# Patient Record
Sex: Male | Born: 1965 | Race: Black or African American | Hispanic: No | Marital: Married | Smoking: Never smoker
Health system: Southern US, Community
[De-identification: ages and names within clinical notes are randomized; demographics above are authoritative.]

## PROBLEM LIST (undated history)

## (undated) DIAGNOSIS — M549 Dorsalgia, unspecified: Secondary | ICD-10-CM

## (undated) DIAGNOSIS — E119 Type 2 diabetes mellitus without complications: Secondary | ICD-10-CM

## (undated) DIAGNOSIS — I472 Ventricular tachycardia: Principal | ICD-10-CM

## (undated) DIAGNOSIS — I10 Essential (primary) hypertension: Secondary | ICD-10-CM

## (undated) DIAGNOSIS — I4891 Unspecified atrial fibrillation: Secondary | ICD-10-CM

## (undated) DIAGNOSIS — G8929 Other chronic pain: Secondary | ICD-10-CM

## (undated) HISTORY — PX: ELECTROPHYSIOLOGY STUDY: SHX5802

## (undated) HISTORY — DX: Dorsalgia, unspecified: M54.9

## (undated) HISTORY — PX: KNEE ARTHROSCOPY W/ MENISCAL REPAIR: SHX1877

## (undated) HISTORY — DX: Other chronic pain: G89.29

## (undated) HISTORY — DX: Ventricular tachycardia: I47.2

## (undated) HISTORY — DX: Unspecified atrial fibrillation: I48.91

## (undated) HISTORY — DX: Essential (primary) hypertension: I10

## (undated) HISTORY — DX: Type 2 diabetes mellitus without complications: E11.9

## (undated) HISTORY — PX: OTHER SURGICAL HISTORY: SHX169

---

## 2011-05-05 DIAGNOSIS — I4891 Unspecified atrial fibrillation: Secondary | ICD-10-CM

## 2011-05-05 HISTORY — DX: Unspecified atrial fibrillation: I48.91

## 2015-05-05 DIAGNOSIS — I472 Ventricular tachycardia, unspecified: Secondary | ICD-10-CM

## 2015-05-05 HISTORY — DX: Ventricular tachycardia: I47.2

## 2015-05-05 HISTORY — DX: Ventricular tachycardia, unspecified: I47.20

## 2016-01-15 DIAGNOSIS — I509 Heart failure, unspecified: Secondary | ICD-10-CM | POA: Insufficient documentation

## 2016-01-15 DIAGNOSIS — I10 Essential (primary) hypertension: Secondary | ICD-10-CM | POA: Insufficient documentation

## 2016-01-15 DIAGNOSIS — E119 Type 2 diabetes mellitus without complications: Secondary | ICD-10-CM | POA: Insufficient documentation

## 2016-01-15 DIAGNOSIS — I4891 Unspecified atrial fibrillation: Secondary | ICD-10-CM | POA: Insufficient documentation

## 2016-01-19 DIAGNOSIS — I472 Ventricular tachycardia: Secondary | ICD-10-CM | POA: Insufficient documentation

## 2016-01-19 DIAGNOSIS — R000 Tachycardia, unspecified: Secondary | ICD-10-CM | POA: Insufficient documentation

## 2016-01-20 DIAGNOSIS — I471 Supraventricular tachycardia, unspecified: Secondary | ICD-10-CM | POA: Insufficient documentation

## 2016-01-21 DIAGNOSIS — I428 Other cardiomyopathies: Secondary | ICD-10-CM | POA: Insufficient documentation

## 2016-01-21 DIAGNOSIS — I429 Cardiomyopathy, unspecified: Secondary | ICD-10-CM | POA: Insufficient documentation

## 2016-01-23 HISTORY — PX: IMPLANTABLE CARDIOVERTER DEFIBRILLATOR IMPLANT: SHX5860

## 2016-02-04 ENCOUNTER — Telehealth: Payer: Self-pay | Admitting: Physician Assistant

## 2016-02-04 ENCOUNTER — Encounter: Payer: Self-pay | Admitting: Physician Assistant

## 2016-02-04 NOTE — Progress Notes (Deleted)
   Cardiology Office Note Date:  02/04/2016  Patient ID:  Rodney Beard, DOB 1965-05-11, MRN [id number] PCP:  No primary care provider on file.  Cardiologist:  ***  ***refresh   Chief Complaint: ***  History of Present Illness: Rodney Beard is a 50 y.o. male with history of ***   *** Family hx *** new patient *** new device   Past Medical History:  Diagnosis Date  . SVT (supraventricular tachycardia) (HCC)     No past surgical history on file.  Current Outpatient Prescriptions  Medication Sig Dispense Refill  . apixaban (ELIQUIS) 5 MG TABS tablet Take 5 mg by mouth 2 (two) times daily.    . carvedilol (COREG) 12.5 MG tablet Take 6.25 mg by mouth 2 (two) times daily with a meal.    . furosemide (LASIX) 40 MG tablet Take 40 mg by mouth 2 (two) times daily.    Marland Kitchen losartan (COZAAR) 25 MG tablet Take 25 mg by mouth daily.    . pantoprazole (PROTONIX) 40 MG tablet Take 40 mg by mouth daily.    . potassium chloride (MICRO-K) 10 MEQ CR capsule Take 10 mEq by mouth 2 (two) times daily.    . sotalol (BETAPACE) 120 MG tablet Take 120 mg by mouth 2 (two) times daily.    . traMADol (ULTRAM) 50 MG tablet Take by mouth daily as needed for moderate pain.     No current facility-administered medications for this visit.     Allergies:   Review of patient's allergies indicates no known allergies.   Social History:  The patient     Family History:  The patient's family history is not on file.***  ROS:  Please see the history of present illness. Otherwise, review of systems is positive for    All other systems are reviewed and otherwise negative.   PHYSICAL EXAM: *** VS:  There were no vitals taken for this visit. BMI: There is no height or weight on file to calculate BMI. Well nourished, well developed, in no acute distress HEENT: normocephalic, atraumatic Neck: no JVD, carotid bruits or masses Cardiac:  normal S1, S2; RRR; no significant murmurs, no rubs, or gallops Lungs:   clear to auscultation bilaterally, no wheezing, rhonchi or rales Abd: soft, nontender MS: no deformity or atrophy Ext: no edema Skin: warm and dry, no rash Neuro:  No gross deficits appreciated Psych: euthymic mood, full affect  *** ICD site is stable, no tethering or discomfort   EKG:  Done today shows ***  Recent Labs: No results found for requested labs within last 8760 hours.  No results found for requested labs within last 8760 hours.   CrCl cannot be calculated (Unknown ideal weight.).   Wt Readings from Last 3 Encounters:  No data found for Wt     Other studies reviewed: Additional studies/records reviewed today include: summarized above***  ASSESSMENT AND PLAN:   1. ***  Disposition: F/u with ***  Current medicines are reviewed at length with the patient today.  The patient did not have any concerns regarding medicines.***  Signed, Sherrilee Gilles, PA-C 02/04/2016 2:34 PM     [facility] [address] [place] [address] [phone] (office)  [phone] (fax)

## 2016-02-04 NOTE — Telephone Encounter (Signed)
Called pt and left message asking pt to call back to update his Fm and medical Hx. °

## 2016-02-05 ENCOUNTER — Encounter: Payer: Self-pay | Admitting: Physician Assistant

## 2016-02-17 ENCOUNTER — Encounter: Payer: Self-pay | Admitting: Physician Assistant

## 2016-02-17 NOTE — Progress Notes (Deleted)
   Cardiology Office Note Date:  02/04/2016  Patient ID:  Rodney Beard, DOB 08/28/1965, MRN [id number] PCP:  No primary care provider on file.  Cardiologist:  ***  ***refresh   Chief Complaint: ***  History of Present Illness: Rodney Beard is a 50 y.o. male with history of ***   *** Family hx *** new patient *** new device   Past Medical History:  Diagnosis Date  . SVT (supraventricular tachycardia) (HCC)     No past surgical history on file.  Current Outpatient Prescriptions  Medication Sig Dispense Refill  . apixaban (ELIQUIS) 5 MG TABS tablet Take 5 mg by mouth 2 (two) times daily.    . carvedilol (COREG) 12.5 MG tablet Take 6.25 mg by mouth 2 (two) times daily with a meal.    . furosemide (LASIX) 40 MG tablet Take 40 mg by mouth 2 (two) times daily.    . losartan (COZAAR) 25 MG tablet Take 25 mg by mouth daily.    . pantoprazole (PROTONIX) 40 MG tablet Take 40 mg by mouth daily.    . potassium chloride (MICRO-K) 10 MEQ CR capsule Take 10 mEq by mouth 2 (two) times daily.    . sotalol (BETAPACE) 120 MG tablet Take 120 mg by mouth 2 (two) times daily.    . traMADol (ULTRAM) 50 MG tablet Take by mouth daily as needed for moderate pain.     No current facility-administered medications for this visit.     Allergies:   Review of patient's allergies indicates no known allergies.   Social History:  The patient     Family History:  The patient's family history is not on file.***  ROS:  Please see the history of present illness. Otherwise, review of systems is positive for    All other systems are reviewed and otherwise negative.   PHYSICAL EXAM: *** VS:  There were no vitals taken for this visit. BMI: There is no height or weight on file to calculate BMI. Well nourished, well developed, in no acute distress HEENT: normocephalic, atraumatic Neck: no JVD, carotid bruits or masses Cardiac:  normal S1, S2; RRR; no significant murmurs, no rubs, or gallops Lungs:   clear to auscultation bilaterally, no wheezing, rhonchi or rales Abd: soft, nontender MS: no deformity or atrophy Ext: no edema Skin: warm and dry, no rash Neuro:  No gross deficits appreciated Psych: euthymic mood, full affect  *** ICD site is stable, no tethering or discomfort   EKG:  Done today shows ***  Recent Labs: No results found for requested labs within last 8760 hours.  No results found for requested labs within last 8760 hours.   CrCl cannot be calculated (Unknown ideal weight.).   Wt Readings from Last 3 Encounters:  No data found for Wt     Other studies reviewed: Additional studies/records reviewed today include: summarized above***  ASSESSMENT AND PLAN:   1. ***  Disposition: F/u with ***  Current medicines are reviewed at length with the patient today.  The patient did not have any concerns regarding medicines.***  Signed, Renee Ursy, PA-C 02/04/2016 2:34 PM     [facility] [address] [place] [address] [phone] (office)  [phone] (fax)   

## 2016-02-18 ENCOUNTER — Encounter: Payer: Self-pay | Admitting: Physician Assistant

## 2016-02-18 NOTE — Progress Notes (Signed)
Cardiology Office Note Date: 02/04/2016 Patient ID: Rodney Beard, DOB 04/12/1966, MRN [id number] PCP:No primary care provider on file. [facility] Dr. Elberta Fortisamnitz   Chief Complaint:new ICD [place] in [place]  History of Present Illness: Rodney Pattersonis a 50 y.o.malewith history of HTN, DM diet controlled, and PAFib, most recently vound with sustained VT and NICM s/p ICD implant last month in [place].  He reports that he was in his [place] when he developed an unusual sensation in his chest, breathless, and weak, got home, checked his BP found it low and went [facility] [place] he states they told him he has a dangerous fast rhythm from the bottom of his heart and transferred hom to a larger hospital in [place] where he reprots having a cardiac cath without CAD (this is his second, the first was in 2013 when found with AF), and what sounds like an EPS and states theytold him they were unable to fix his arrhythmia and ultimately had an ICD implant.  He was started on his current regime (his Eliquis and Tramadol was on previously) and has felt well since.  He will occasionally be aware of his heart beat, no other symptoms.  No CP or SOB, but has not been doin much in the way of physical activity since.  No dizziness, near syncope or syncope, no shocks.   Device information: BSci dual chamber ICD, implanted 01/23/16, Dr. Glory RosebushJohn Russell Balley, NICM, sustained VT/secondary prevention       Past Medical History:  Diagnosis Date  . SVT (supraventricular tachycardia) (HCC)     No past surgical history on file.        Current Outpatient Prescriptions  Medication Sig Dispense Refill  . apixaban (ELIQUIS) 5 MG TABS tablet Take 5 mg by mouth 2 (two) times daily.    . carvedilol (COREG) 12.5 MG tablet Take 6.25 mg by mouth 2 (two) times daily with a meal.    . furosemide (LASIX) 40 MG tablet Take 40 mg by mouth 2 (two) times  daily.    Marland Kitchen. losartan (COZAAR) 25 MG tablet Take 25 mg by mouth daily.    . pantoprazole (PROTONIX) 40 MG tablet Take 40 mg by mouth daily.    . potassium chloride (MICRO-K) 10 MEQ CR capsule Take 10 mEq by mouth 2 (two) times daily.    . sotalol (BETAPACE) 120 MG tablet Take 120 mg by mouth 2 (two) times daily.    . traMADol (ULTRAM) 50 MG tablet Take by mouth daily as needed for moderate pain.     No current facility-administered medications for this visit.     Allergies: Iodine, shellfish with SOB and facial edema  Social History: never smoked, no ETOH, drugs   Family History:The patient's family history is noted in his chart, + heart disease bu his MOM, HTN, CVA by his father, a brother recently died of MI  [id number]:Please see the history of present illness. Otherwise, review of systems is positive for  All other systems are reviewed and otherwise negative.   PHYSICAL EXAM: VS:118/84,  76bpm,  283lbs Well nourished, well developed, in no acute distress  HEENT: normocephalic, atraumatic  Neck: no JVD, carotid bruits or masses Cardiac:  RRR; no significant murmurs, no rubs, or gallops Lungs: clear to auscultation bilaterally, no wheezing, rhonchi or rales  Abd: soft, nontender MS: no deformity or atrophy Ext: no edema  Skin: warm and dry, no rash Neuro: No gross deficits appreciated Psych:euthymic mood, full affect  ICD  site is stable, no tethering or discomfort, steri strips removed   TXH:FSFS today reviewed by myself/Dr. Elberta Fortis shows SR, inf/lat T changes, QTc , PVC's, couplet ICD interrogation today reviewed by myself/Dr. Elberta Fortis notes: normal device function, 2 very brief NSVT  Recent Labs: No results found for requested labs within last 8760 hours. No results found for requested labs within last 8760 hours.  CrCl cannot be calculated (Unknown ideal weight.).  Wt Readings from Last 3 Encounters:  No data found for Wt      Other studies reviewed: Additional studies/records reviewed today include: summarized above  ASSESSMENT AND PLAN:  1. NICM     Exam appears euvolemic  2. VT w/ICD     Normal device function, will plan to change out puts at his 3 mo f/u visit to chronic settings     Site is stable     Patient is reminded of his L arm activity restrictions  3. HTN     Stable  4. PAFib     CHA2DS2Vasc is at least 2, on Eliquis    Will get BMET, CBC, Mag today given PVCs, new meds and his a/c He has signed to get his records from [facility] in [place]   Disposition: F/u with Dr. Elberta Fortis in 85mo, sooner if needed.  Current medicines are reviewed at length with the patient today. The patient did not have any concerns regarding medicines.  Signed, Sherrilee Gilles, PA-C 10/3/20172:34 PM  [facility] [address] [place] [address] [phone] (office)  [phone] (fax)

## 2016-02-19 ENCOUNTER — Encounter (INDEPENDENT_AMBULATORY_CARE_PROVIDER_SITE_OTHER): Payer: Self-pay

## 2016-02-19 ENCOUNTER — Encounter: Payer: Self-pay | Admitting: Physician Assistant

## 2016-02-19 ENCOUNTER — Ambulatory Visit (INDEPENDENT_AMBULATORY_CARE_PROVIDER_SITE_OTHER): Admitting: Physician Assistant

## 2016-02-19 VITALS — BP 118/84 | HR 76 | Ht 72.0 in | Wt 283.0 lb

## 2016-02-19 DIAGNOSIS — I480 Paroxysmal atrial fibrillation: Secondary | ICD-10-CM | POA: Diagnosis not present

## 2016-02-19 DIAGNOSIS — I472 Ventricular tachycardia, unspecified: Secondary | ICD-10-CM

## 2016-02-19 DIAGNOSIS — I420 Dilated cardiomyopathy: Secondary | ICD-10-CM

## 2016-02-19 DIAGNOSIS — I10 Essential (primary) hypertension: Secondary | ICD-10-CM

## 2016-02-19 DIAGNOSIS — I471 Supraventricular tachycardia: Secondary | ICD-10-CM | POA: Diagnosis not present

## 2016-02-19 DIAGNOSIS — Z5181 Encounter for therapeutic drug level monitoring: Secondary | ICD-10-CM

## 2016-02-19 LAB — BASIC METABOLIC PANEL
BUN: 15 mg/dL (ref 7–25)
CHLORIDE: 104 mmol/L (ref 98–110)
CO2: 26 mmol/L (ref 20–31)
CREATININE: 1.12 mg/dL (ref 0.70–1.33)
Calcium: 9.1 mg/dL (ref 8.6–10.3)
Glucose, Bld: 160 mg/dL — ABNORMAL HIGH (ref 65–99)
POTASSIUM: 4.1 mmol/L (ref 3.5–5.3)
Sodium: 140 mmol/L (ref 135–146)

## 2016-02-19 LAB — CBC
HEMATOCRIT: 42.1 % (ref 38.5–50.0)
HEMOGLOBIN: 14.3 g/dL (ref 13.2–17.1)
MCH: 32.9 pg (ref 27.0–33.0)
MCHC: 34 g/dL (ref 32.0–36.0)
MCV: 97 fL (ref 80.0–100.0)
MPV: 10.3 fL (ref 7.5–12.5)
PLATELETS: 248 10*3/uL (ref 140–400)
RBC: 4.34 MIL/uL (ref 4.20–5.80)
RDW: 12.4 % (ref 11.0–15.0)
WBC: 8.2 10*3/uL (ref 3.8–10.8)

## 2016-02-19 LAB — MAGNESIUM: MAGNESIUM: 2.2 mg/dL (ref 1.5–2.5)

## 2016-02-19 NOTE — Patient Instructions (Addendum)
Medication Instructions:   Your physician recommends that you continue on your current medications as directed. Please refer to the Current Medication list given to you today.   If you need a refill on your cardiac medications before your next appointment, please call your pharmacy.  Labwork: BMET  MAG AND  CBC TODAY    Testing/Procedures: NONE ORDER TODAY     Follow-Up:  In 3 MONTHS WITH DR CAMNITZ  Any Other Special Instructions Will Be Listed Below (If Applicable).

## 2016-02-25 ENCOUNTER — Telehealth: Payer: Self-pay | Admitting: *Deleted

## 2016-02-25 NOTE — Telephone Encounter (Signed)
LMOVM TO CALL BACK  

## 2016-02-25 NOTE — Telephone Encounter (Signed)
-----   Message from [facility] sent at 02/25/2016  4:54 AM EDT ----- Please let the patient know his labs look OK, electrolytes, kidney function and blood counts.  Thanks [employer]

## 2016-02-26 ENCOUNTER — Telehealth: Payer: Self-pay | Admitting: Physician Assistant

## 2016-02-26 ENCOUNTER — Telehealth: Payer: Self-pay | Admitting: *Deleted

## 2016-02-26 NOTE — Telephone Encounter (Signed)
SPOKE TO PT ABOUT RESULTS AND VERBALIZED UNDERSTANDING  

## 2016-02-26 NOTE — Telephone Encounter (Signed)
New Message  Pt voiced he is returning call about results.  Please f/u with pt

## 2016-02-26 NOTE — Telephone Encounter (Signed)
-----   Message from Renee Lynn Ursuy, PA-C sent at 02/25/2016  4:54 AM EDT ----- Please let the patient know his labs look OK, electrolytes, kidney function and blood counts.  Thanks Renee 

## 2016-04-29 ENCOUNTER — Encounter: Payer: Self-pay | Admitting: *Deleted

## 2016-05-18 ENCOUNTER — Encounter: Admitting: Cardiology

## 2016-05-24 ENCOUNTER — Emergency Department (HOSPITAL_COMMUNITY)
Admission: EM | Admit: 2016-05-24 | Discharge: 2016-05-24 | Disposition: A | Discharge status: Home / Self Care | Attending: Emergency Medicine | Admitting: Emergency Medicine

## 2016-05-24 ENCOUNTER — Emergency Department (HOSPITAL_COMMUNITY)

## 2016-05-24 ENCOUNTER — Encounter (HOSPITAL_COMMUNITY): Payer: Self-pay

## 2016-05-24 DIAGNOSIS — D68318 Other hemorrhagic disorder due to intrinsic circulating anticoagulants, antibodies, or inhibitors: Secondary | ICD-10-CM | POA: Insufficient documentation

## 2016-05-24 DIAGNOSIS — Y92481 Parking lot as the place of occurrence of the external cause: Secondary | ICD-10-CM | POA: Insufficient documentation

## 2016-05-24 DIAGNOSIS — S0990XA Unspecified injury of head, initial encounter: Secondary | ICD-10-CM | POA: Insufficient documentation

## 2016-05-24 DIAGNOSIS — I10 Essential (primary) hypertension: Secondary | ICD-10-CM | POA: Insufficient documentation

## 2016-05-24 DIAGNOSIS — W01119A Fall on same level from slipping, tripping and stumbling with subsequent striking against unspecified sharp object, initial encounter: Secondary | ICD-10-CM | POA: Diagnosis not present

## 2016-05-24 DIAGNOSIS — Z79899 Other long term (current) drug therapy: Secondary | ICD-10-CM | POA: Diagnosis not present

## 2016-05-24 DIAGNOSIS — Y9301 Activity, walking, marching and hiking: Secondary | ICD-10-CM | POA: Insufficient documentation

## 2016-05-24 DIAGNOSIS — E119 Type 2 diabetes mellitus without complications: Secondary | ICD-10-CM | POA: Diagnosis not present

## 2016-05-24 DIAGNOSIS — Z7901 Long term (current) use of anticoagulants: Secondary | ICD-10-CM | POA: Insufficient documentation

## 2016-05-24 DIAGNOSIS — Y999 Unspecified external cause status: Secondary | ICD-10-CM | POA: Insufficient documentation

## 2016-05-24 LAB — CBC WITH DIFFERENTIAL/PLATELET
BASOS ABS: 0 10*3/uL (ref 0.0–0.1)
BASOS PCT: 0 %
EOS PCT: 2 %
Eosinophils Absolute: 0.1 10*3/uL (ref 0.0–0.7)
HCT: 42 % (ref 39.0–52.0)
HEMOGLOBIN: 13.9 g/dL (ref 13.0–17.0)
LYMPHS ABS: 1.8 10*3/uL (ref 0.7–4.0)
Lymphocytes Relative: 28 %
MCH: 32.1 pg (ref 26.0–34.0)
MCHC: 33.1 g/dL (ref 30.0–36.0)
MCV: 97 fL (ref 78.0–100.0)
Monocytes Absolute: 0.4 10*3/uL (ref 0.1–1.0)
Monocytes Relative: 6 %
NEUTROS PCT: 64 %
Neutro Abs: 4.2 10*3/uL (ref 1.7–7.7)
PLATELETS: 254 10*3/uL (ref 150–400)
RBC: 4.33 MIL/uL (ref 4.22–5.81)
RDW: 12.3 % (ref 11.5–15.5)
WBC: 6.5 10*3/uL (ref 4.0–10.5)

## 2016-05-24 LAB — BASIC METABOLIC PANEL
Anion gap: 7 (ref 5–15)
BUN: 12 mg/dL (ref 6–20)
CHLORIDE: 102 mmol/L (ref 101–111)
CO2: 27 mmol/L (ref 22–32)
CREATININE: 1.22 mg/dL (ref 0.61–1.24)
Calcium: 9.2 mg/dL (ref 8.9–10.3)
GFR calc non Af Amer: >60 mL/min (ref >60)
GLUCOSE: 173 mg/dL — AB (ref 65–99)
Potassium: 4.5 mmol/L (ref 3.5–5.1)
Sodium: 136 mmol/L (ref 135–145)

## 2016-05-24 NOTE — ED Notes (Signed)
Patient transported to CT 

## 2016-05-24 NOTE — Discharge Instructions (Signed)
Tylenol 1000 mg every 6 hours as needed for headache.  Return to the emergency department if you develop worsening headache, confusion, visual disturbances, vomiting, balance or gait disturbances, or other new and concerning symptoms.

## 2016-05-24 NOTE — ED Triage Notes (Signed)
Pt. Was going to church and slipped on ice.  Pt. Fell straight back, hematoma to the back of his head.    Pt. Is alert and oriented X4.   He denies any pain.  Pt. Has a hx of A-fib and HR 127-130. He denies chest pain or sob.  Skin is warm and dry.

## 2016-05-24 NOTE — ED Provider Notes (Signed)
[facility]-EMERGENCY DEPT Provider Note   CSN: [id number] Arrival date & time: 05/24/16  4098     History   Chief Complaint Chief Complaint  Patient presents with  . Fall  . Head Injury    HPI Rodney Beard is a 51 y.o. male.  Patient is a 51 year old male with history of A. fib, V. tach, hypertension, diabetes. He presents for evaluation of a fall. He was walking through the parking lot on his way to church when he slipped on ice, fell backward, and hit the back of his head on the concrete. He denies any loss of consciousness, headache, or neck pain. He does take blood thinners.   The history is provided by the patient.  Fall  This is a new problem. The current episode started less than 1 hour ago. The problem occurs constantly. The problem has not changed since onset.Pertinent negatives include no chest pain, no abdominal pain, no headaches and no shortness of breath. Nothing aggravates the symptoms. Nothing relieves the symptoms. He has tried nothing for the symptoms.  Head Injury      Past Medical History:  Diagnosis Date  . Atrial fibrillation (HCC) 2013  . Chronic back pain   . Diabetes (HCC)    diet controlled  . Hypertension   . Ventricular tachycardia (HCC) 2017    There are no active problems to display for this patient.   Past Surgical History:  Procedure Laterality Date  . ELECTROPHYSIOLOGY STUDY     per the patient, Sept 2017, "unable to fix his arrhythmia"  . IMPLANTABLE CARDIOVERTER DEFIBRILLATOR IMPLANT  01/23/2016  . KNEE ARTHROSCOPY W/ MENISCAL REPAIR Left   . left heart cath     per the patient , negetive for CAD, Sept 2017       Home Medications    Prior to Admission medications   Medication Sig Start Date End Date Taking? Authorizing Provider  apixaban (ELIQUIS) 5 MG TABS tablet Take 5 mg by mouth 2 (two) times daily.    Historical Provider, MD  carvedilol (COREG) 12.5 MG tablet Take 6.25 mg by mouth 2 (two) times daily with a meal.     Historical Provider, MD  furosemide (LASIX) 40 MG tablet Take 40 mg by mouth 2 (two) times daily.    Historical Provider, MD  losartan (COZAAR) 25 MG tablet Take 25 mg by mouth daily.    Historical Provider, MD  pantoprazole (PROTONIX) 40 MG tablet Take 40 mg by mouth daily.    Historical Provider, MD  potassium chloride (MICRO-K) 10 MEQ CR capsule Take 10 mEq by mouth 2 (two) times daily.    Historical Provider, MD  sotalol (BETAPACE) 120 MG tablet Take 120 mg by mouth 2 (two) times daily.    Historical Provider, MD  traMADol (ULTRAM) 50 MG tablet Take by mouth daily as needed for moderate pain.    Historical Provider, MD    Family History Family History  Problem Relation Age of Onset  . Heart disease Mother   . Stroke Father   . Hypertension Father   . Heart attack Brother     Social History Social History  Substance Use Topics  . Smoking status: Never Smoker  . Smokeless tobacco: Never Used  . Alcohol use No     Allergies   Iodine and Shellfish allergy   Review of Systems Review of Systems  Respiratory: Negative for shortness of breath.   Cardiovascular: Negative for chest pain.  Gastrointestinal: Negative for abdominal pain.  Neurological:  Negative for headaches.  All other systems reviewed and are negative.    Physical Exam Updated Vital Signs BP 127/80 (BP Location: Left Arm)   Pulse (!) 128   Temp 97.6 F (36.4 C) (Oral)   Resp 20   Ht 6' (1.829 m)   Wt 279 lb 1 oz (126.6 kg)   SpO2 98%   BMI 37.85 kg/m   Physical Exam  Constitutional: He is oriented to person, place, and time. He appears well-developed and well-nourished. No distress.  HENT:  Head: Normocephalic.  Mouth/Throat: Oropharynx is clear and moist.  There is mild swelling and and abrasion to the occiput  Eyes: EOM are normal. Pupils are equal, round, and reactive to light.  Neck: Normal range of motion. Neck supple.  There is no cervical spine tenderness. Patient has painless range of  motion in all directions.  Cardiovascular: Normal rate and regular rhythm.  Exam reveals no friction rub.   No murmur heard. Pulmonary/Chest: Effort normal and breath sounds normal. No respiratory distress. He has no wheezes. He has no rales.  Abdominal: Soft. Bowel sounds are normal. He exhibits no distension. There is no tenderness.  Musculoskeletal: Normal range of motion. He exhibits no edema.  Neurological: He is alert and oriented to person, place, and time. No cranial nerve deficit. He exhibits normal muscle tone. Coordination normal.  Skin: Skin is warm and dry. He is not diaphoretic.  Nursing note and vitals reviewed.    ED Treatments / Results  Labs (all labs ordered are listed, but only abnormal results are displayed) Labs Reviewed - No data to display  EKG  EKG Interpretation  Date/Time:  Sunday May 24 2016 08:43:02 EST Ventricular Rate:  74 PR Interval:  188 QRS Duration: 74 QT Interval:  414 QTC Calculation: 459 R Axis:   74 Text Interpretation:  Atrial-paced rhythm with frequent Premature ventricular complexes Anterior infarct , age undetermined T wave abnormality, consider inferolateral ischemia Abnormal ECG Confirmed by Innocence Schlotzhauer  MD, Jacquelin Krajewski ([id number]) on 05/24/2016 8:56:53 AM       Radiology No results found.  Procedures Procedures (including critical care time)  Medications Ordered in ED Medications - No data to display   Initial Impression / Assessment and Plan / ED Course  I have reviewed the triage vital signs and the nursing notes.  Pertinent labs & imaging results that were available during my care of the patient were reviewed by me and considered in my medical decision making (see chart for details).     Patient taking eliquis, fell this morning walking to church and hit his head. He is neurologically intact and CT is negative. He will be discharged, to return as needed for any problems.  Final Clinical Impressions(s) / ED Diagnoses   Final  diagnoses:  None    New Prescriptions New Prescriptions   No medications on file     Geoffery Lyons, MD 05/24/16 1130

## 2016-11-23 ENCOUNTER — Encounter: Payer: Self-pay | Admitting: Cardiology

## 2017-07-18 IMAGING — CT CT HEAD W/O CM
4 series · 17 of 47 positions shown, 19 images · non-contrast
Comparison: None.

CLINICAL DATA: Pain after fall.

EXAM:
CT HEAD WITHOUT CONTRAST
TECHNIQUE: Contiguous axial images were obtained from the base of the skull
through the vertex without intravenous contrast.

[Series 2: head without · axial · non-contrast · 0.48mm/px · z∈[-130,-5]mm · 7 of 35 slices shown, 9 images]
[im 5/35  brain]
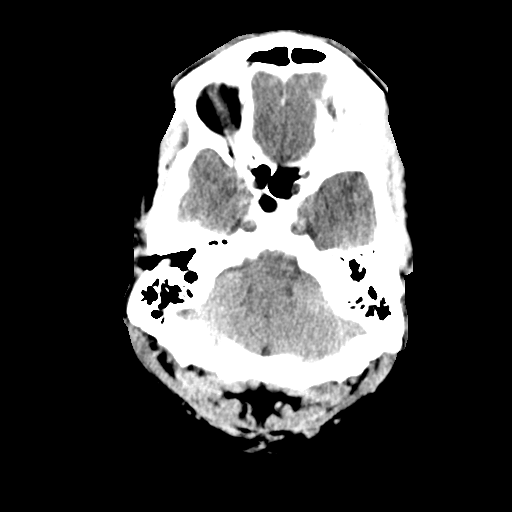
[im 5/35  bone]
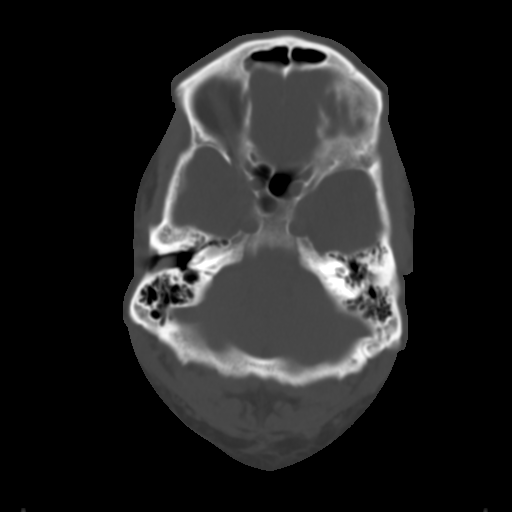
[im 9/35  brain]
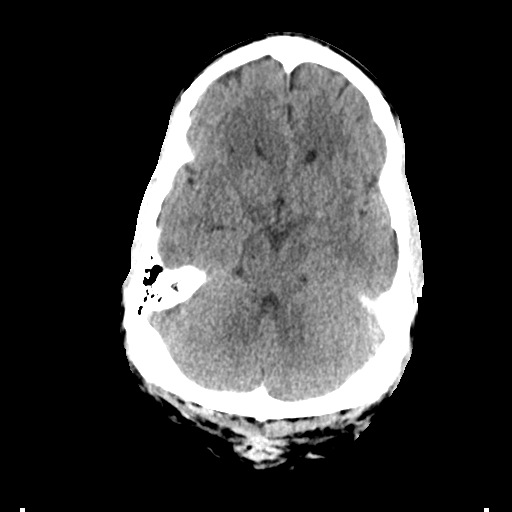
[im 13/35  brain]
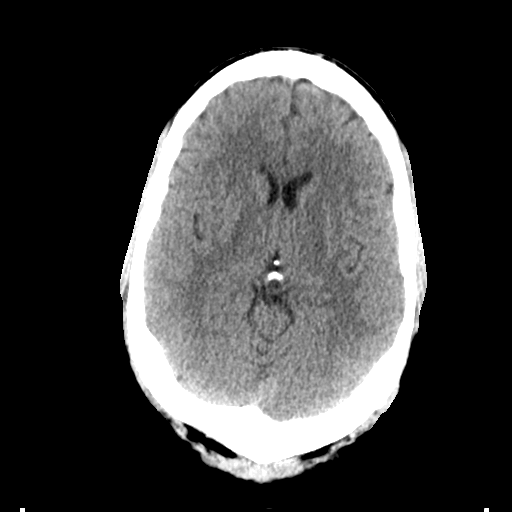
[im 18/35  brain]
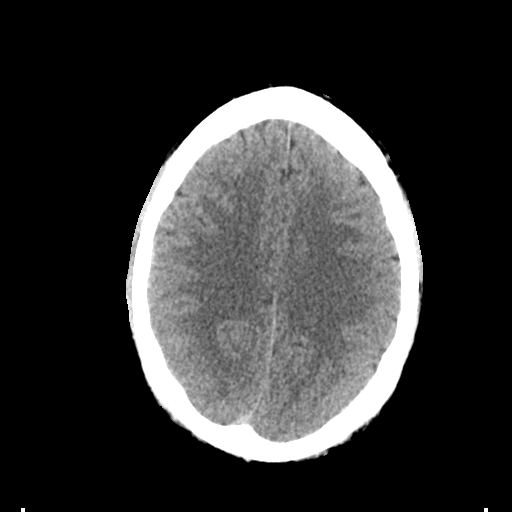
[im 22/35  brain]
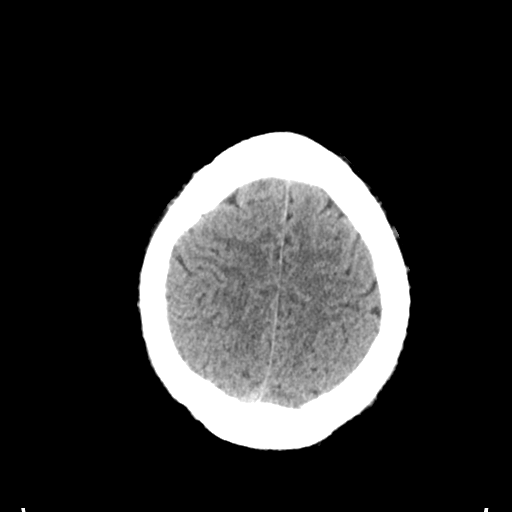
[im 22/35  bone]
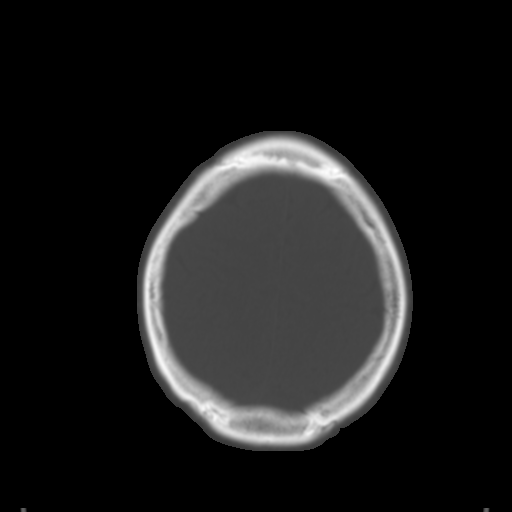
[im 26/35  brain]
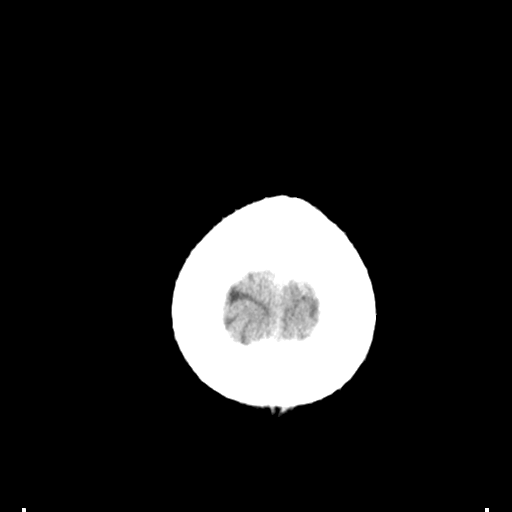
[im 30/35  brain]
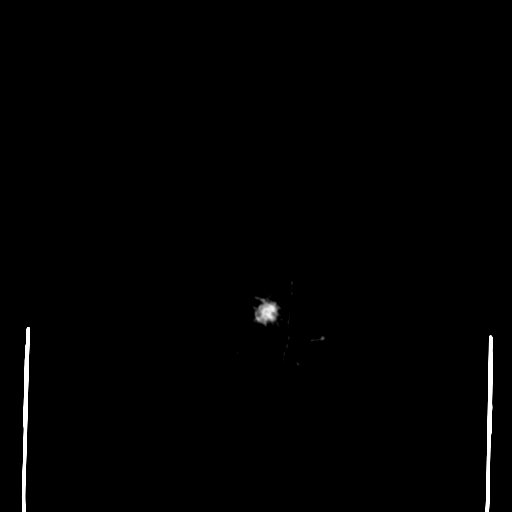

[Series 3: head bone · axial · 0.48mm/px · z∈[-134,-74]mm · 4 of 86 slices shown]
[im 9/86  bone]
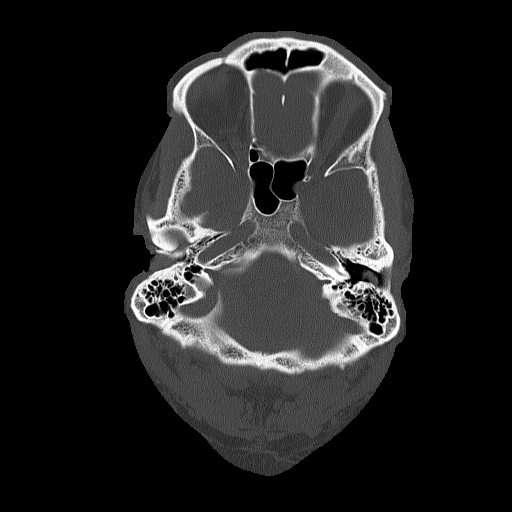
[im 18/86  bone]
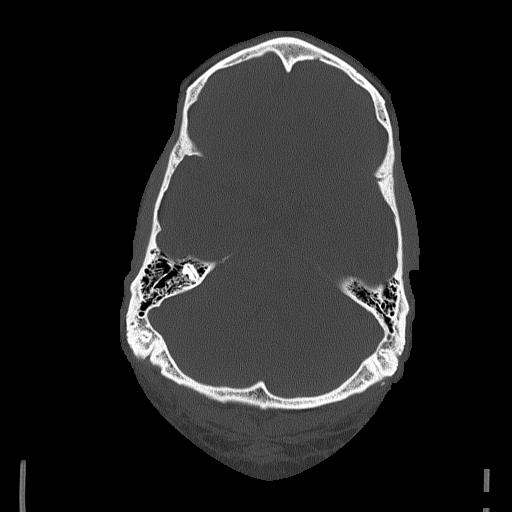
[im 26/86  bone]
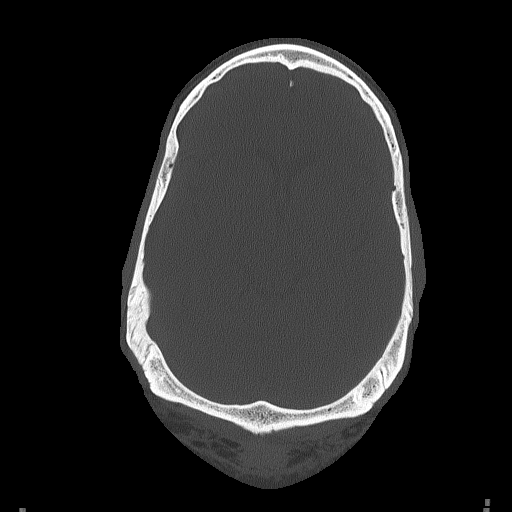
[im 39/86  bone]
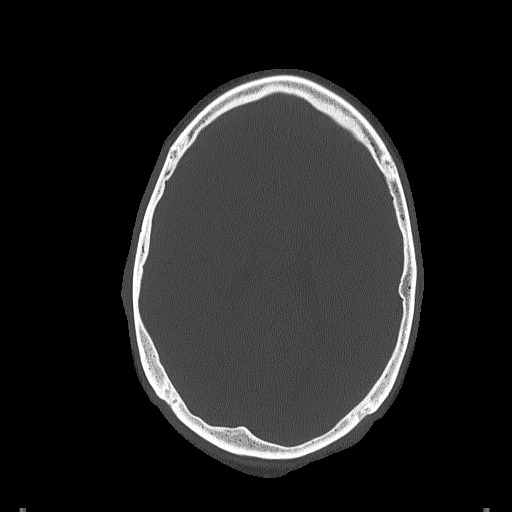

[Series 4: head without cor · coronal · non-contrast · 0.33mm/px · 3 of 67 slices shown]
[im 23/67  brain]
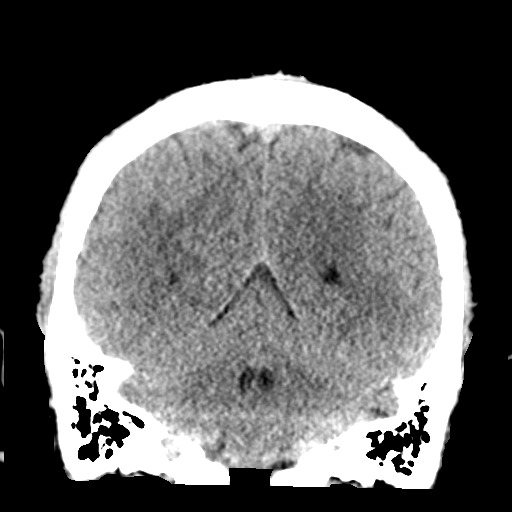
[im 30/67  brain]
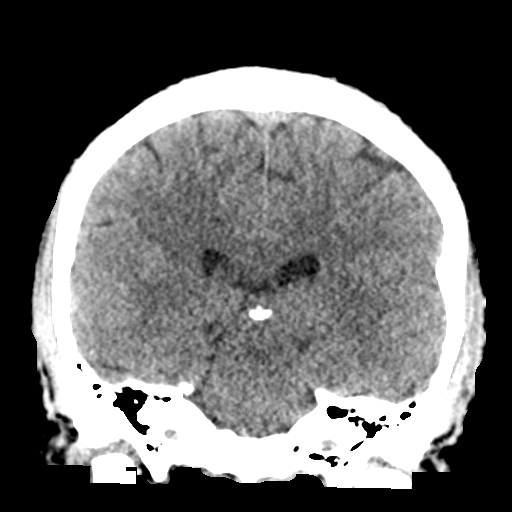
[im 37/67  brain]
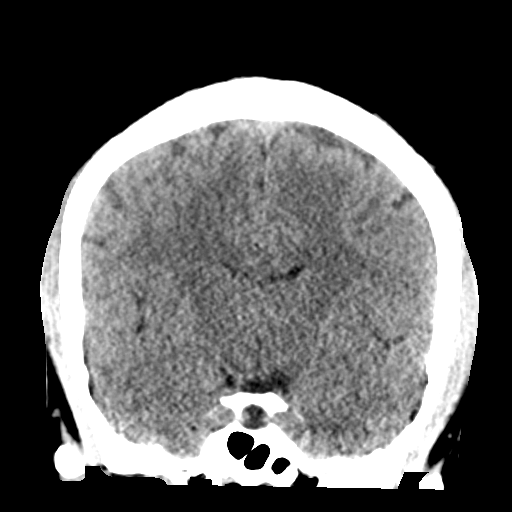

[Series 5: head without sag · sagittal · non-contrast · 0.33mm/px · 3 of 67 slices shown]
[im 23/67  brain]
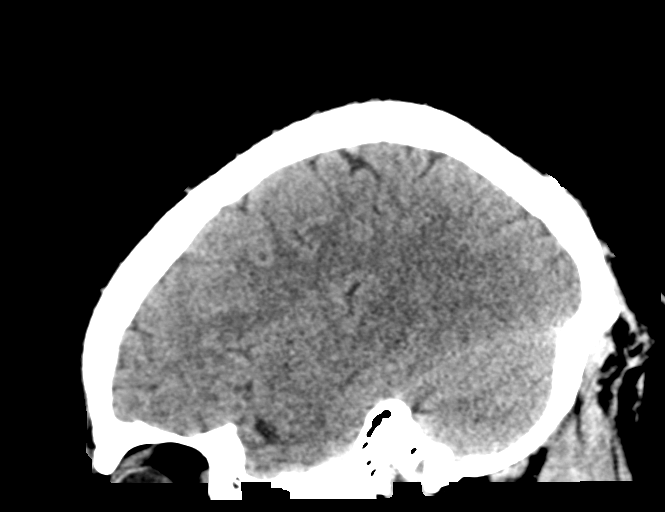
[im 34/67  brain]
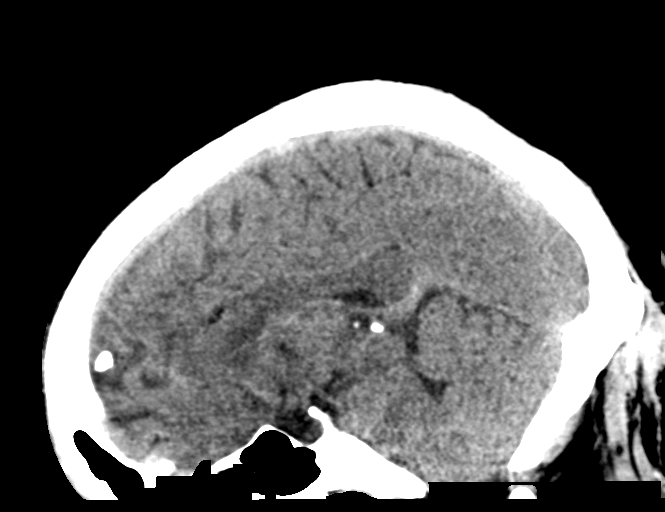
[im 45/67  brain]
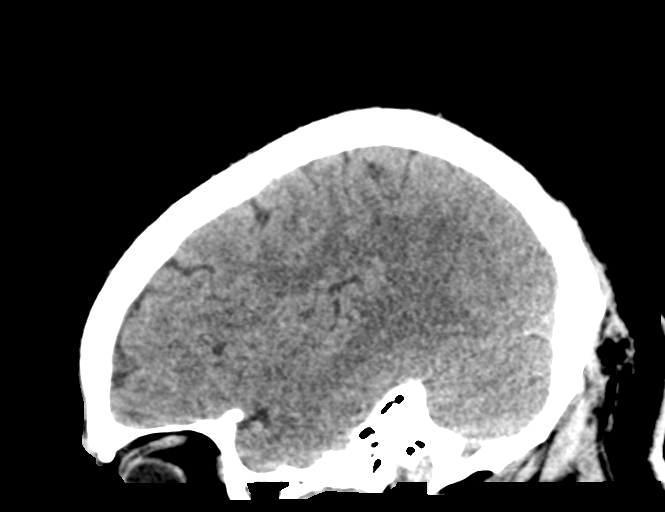

[17 of 47 positions shown; findings below may reference images not displayed]

FINDINGS: Brain: No evidence of acute infarction, hemorrhage, hydrocephalus,
extra-axial collection or mass lesion/mass effect.

Vascular: No hyperdense vessel or unexpected calcification.

Skull: Normal. Negative for fracture or focal lesion.

Sinuses/Orbits: No acute finding.

Other: None.
IMPRESSION: No acute abnormalities.

## 2017-09-30 ENCOUNTER — Encounter (HOSPITAL_COMMUNITY): Payer: Self-pay | Admitting: Psychiatry

## 2017-09-30 ENCOUNTER — Ambulatory Visit (INDEPENDENT_AMBULATORY_CARE_PROVIDER_SITE_OTHER): Admitting: Psychiatry

## 2017-09-30 VITALS — BP 117/84 | HR 84 | Ht 72.0 in | Wt 289.0 lb

## 2017-09-30 DIAGNOSIS — F4310 Post-traumatic stress disorder, unspecified: Secondary | ICD-10-CM

## 2017-09-30 DIAGNOSIS — Z818 Family history of other mental and behavioral disorders: Secondary | ICD-10-CM

## 2017-09-30 DIAGNOSIS — Z9149 Other personal history of psychological trauma, not elsewhere classified: Secondary | ICD-10-CM

## 2017-09-30 DIAGNOSIS — F419 Anxiety disorder, unspecified: Secondary | ICD-10-CM | POA: Diagnosis not present

## 2017-09-30 DIAGNOSIS — G4700 Insomnia, unspecified: Secondary | ICD-10-CM

## 2017-09-30 MED ORDER — PAROXETINE HCL 10 MG PO TABS: ORAL_TABLET | ORAL | 0 refills | Status: AC

## 2017-09-30 NOTE — Progress Notes (Signed)
Psychiatric Initial Adult Assessment   Patient Identification: Rodney Beard MRN:  [id number] Date of Evaluation:  09/30/2017 Referral Source: Primary care physician/self-referred  Chief Complaint:  I have anxiety and nervousness.  Visit Diagnosis:    ICD-10-CM   1. 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  Patient self-referred because having symptoms of anxiety and nervousness and poor sleep.  Patient is a retired Cytogeneticist and he had served in [employer] from 1985-2007.  He was stationed in [place], [place] and in [place].  He was involved in combat and seen his soldiers getting killed and injured.  He had suppressed his memory for a long time until 2 years ago he had episode of rapid heart rate and he was taken to the hospital and given shock treatment.  Since then he started to have symptoms of extreme anxiety, nervousness, insecurity, paranoia, hypervigilance, shortness of breath and racing thoughts.  There are days when he does not sleep for a few hours.  He also endorsed getting easily irritable, angry, emotional and lack of interest to do things.  He used to enjoy fishing but he has cut down his outdoor activities.  Though he denies any suicidal thoughts or any homicidal thoughts but admitted isolated, withdrawn and detached from the surroundings.  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  He worried about his health.  He is concerned that he may have episodes of rapid heart rate again.  He admitted analyzing himself too much and sometime over thinking about the future.  He endorsed nightmares, flashback about his past.  He used to watch and enjoy water movies but he has not done recently.  He denies any hallucination,  self abusive behavior, aggressive behavior, mania or psychosis.  His appetite is okay.  His energy level is fair.  Sometimes his attention and concentration is not good and he gets easily distracted.  Patient has multiple health issues including ventricular tachycardia, A. fib, SVT, idiopathic cardiomyopathy, CHF, chronic back pain and diabetes controlled with diet.  He lives with his wife who is very supportive.  Patient denies drinking or using any illegal substances.  Currently he is not taking any psychiatric medication and he is not seeing any therapist.  Associated Signs/Symptoms: Depression Symptoms:  depressed mood, insomnia, difficulty concentrating, anxiety, loss of energy/fatigue, disturbed sleep, (Hypo) Manic Symptoms:  Irritable Mood, Anxiety Symptoms:  Excessive Worry, Social Anxiety, Psychotic Symptoms:  No psychotic symptoms PTSD Symptoms: Had a traumatic exposure:  Patient had served in [employer] and he was exposed to violence and death when he was stationed in [place].  2 years ago he had an incident when all of a sudden he had a increased heart rate and he was given shock treatment.  He started to have nightmares and flashback. Re-experiencing:  Flashbacks Intrusive Thoughts Nightmares Hypervigilance:  Yes Hyperarousal:  Difficulty Concentrating Emotional Numbness/Detachment Irritability/Anger Avoidance:  Foreshortened Future  Past Psychiatric History: Patient denies any history of psychiatric inpatient treatment, suicidal attempt, paranoia or any hallucination.  He is a Investment banker, operational and he had served in [employer] in combat when he was in [place].  He has seen his coworker dying and injured.  He has nightmares and flashback.  Previous Psychotropic Medications: No  Substance Abuse History in the last 12 months:  No.  Consequences of Substance Abuse: Negative  Past Medical History:  Past Medical History:  Diagnosis Date  . Atrial fibrillation (HCC) 2013  . Chronic back pain    . Diabetes (HCC)    diet controlled  . Hypertension   . Ventricular tachycardia (HCC) 2017    Past Surgical History:  Procedure Laterality Date  . ELECTROPHYSIOLOGY STUDY     per the patient, Sept 2017, "unable to fix his arrhythmia"  . IMPLANTABLE CARDIOVERTER DEFIBRILLATOR IMPLANT  01/23/2016  . KNEE ARTHROSCOPY W/ MENISCAL REPAIR Left   . left heart cath     per the patient , negetive for CAD, Sept 2017    Family Psychiatric History: Sister has depression  Family History:  Family History  Problem Relation Age of Onset  . Heart disease Mother   . Stroke Father   . Hypertension Father   . Heart attack Brother     Social History:   Social History   Socioeconomic History  . Marital status: Married    Spouse name: Not on file  . Number of children: Not on file  . Years of education: Not on file  . Highest education level: Not on file  Occupational History  . Not on file  Social Needs  . Financial resource strain: Not hard at all  . Food insecurity:    Worry: Never true    Inability: Never true  . Transportation needs:    Medical: No    Non-medical: No  Tobacco Use  . Smoking status: Never Smoker  . Smokeless tobacco: Never Used  Substance and Sexual Activity  . Alcohol use: No  . Drug use: No  . Sexual activity: Not on file  Lifestyle  . Physical activity:    Days per week: 4 days    Minutes per session: 60 min  . Stress: Rather much  Relationships  . Social connections:    Talks on phone: More than three times a week    Gets together: Once a week    Attends religious service: More than 4 times per year    Active member of club or organization: No    Attends meetings of clubs or organizations: Never    Relationship status: Married  Other Topics Concern  . Not on file  Social History Narrative  . Not on file    Additional Social History: Patient born in [place] and grew up in [place].  His father deceased.  He married twice.  His first  marriage last for 12 years.  He has 5 children.  His first 3 children are from previous relationship.  He has 2 children from his current marriage but they were born when patient was with his current wife previously.  Patient is in touch with his parents.  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  He also like to coach basketball team.  He enjoys fishing and sometimes goes to [place] for fishing.  Allergies:   Allergies  Allergen Reactions  . Iodine     SOB AND FACIAL EDEMA  . Shellfish Allergy     SOB AND FACIAL EDEMA    Metabolic Disorder Labs: No results found for: HGBA1C, MPG No results found for: PROLACTIN No results found for: CHOL, TRIG, HDL, CHOLHDL, VLDL, LDLCALC   Current Medications: Current Outpatient Medications  Medication Sig Dispense Refill  . apixaban (ELIQUIS) 5 MG TABS tablet Take  5 mg by mouth 2 (two) times daily.    . furosemide (LASIX) 40 MG tablet Take 40 mg by mouth daily.     Marland Kitchen gabapentin (NEURONTIN) 300 MG capsule Take 300 mg by mouth at bedtime.    Marland Kitchen losartan (COZAAR) 25 MG tablet Take 25 mg by mouth daily.    . sotalol (BETAPACE) 120 MG tablet Take 120 mg by mouth 2 (two) times daily.    Marland Kitchen PARoxetine (PAXIL) 10 MG tablet Take one tab daily for one week and than 2 tab daily 60 tablet 0   No current facility-administered medications for this visit.     Neurologic: Headache: No Seizure: No Paresthesias:No  Musculoskeletal: Strength & Muscle Tone: within normal limits Gait & Station: normal Patient leans: N/A  Psychiatric Specialty Exam: Review of Systems  Psychiatric/Behavioral: The patient is nervous/anxious and has insomnia.     Blood pressure 117/84, pulse 84, height 6' (1.829 m), weight 289 lb (131.1 kg), SpO2 96 %.Body mass index is 39.2 kg/m.  General Appearance: Casual  Eye Contact:  Fair  Speech:  Clear and Coherent  Volume:  Normal  Mood:  Anxious and Dysphoric  Affect:  Constricted  Thought Process:  Goal  Directed  Orientation:  Full (Time, Place, and Person)  Thought Content:  Rumination  Suicidal Thoughts:  No  Homicidal Thoughts:  No  Memory:  Immediate;   Good Recent;   Good Remote;   Good  Judgement:  Good  Insight:  Good  Psychomotor Activity:  Normal  Concentration:  Concentration: Fair and Attention Span: Fair  Recall:  [employer] of Knowledge:Good  Language: Good  Akathisia:  No  Handed:  Right  AIMS (if indicated):  0  Assets:  Communication Skills Desire for Improvement Housing Resilience Social Support Talents/Skills Transportation  ADL's:  Intact  Cognition: WNL  Sleep:  fair   Assessment: Posttraumatic stress disorder.  Generalized anxiety disorder.  Plan: I review his symptoms, history, current medication and psychosocial stressors.  Discussed to try antianxiety medication to help PTSD symptoms.  We will start Paxil 10 mg for 2 weeks and then 20 mg daily.  Discussed medication side effect especially tremors, shakes, sedation and EPS.  I do believe patient will get benefit with CBT and I will referred him to see a therapist for counseling.  We will also get records from primary care physician.  Patient is taking gabapentin 300 mg at bedtime for insomnia and chronic pain.  Recommended to call us back if he has any question, concern if he feels worsening of the symptoms.  Discussed safety concerns at any time having active suicidal thoughts or homicidal thought that he need to call 911 or go to local emergency room.  Follow-up in 4 weeks.  Cleotis Nipper, MD 5/30/20199:46 AM

## 2017-11-01 ENCOUNTER — Ambulatory Visit (HOSPITAL_COMMUNITY): Admitting: Psychiatry

## 2017-11-02 ENCOUNTER — Ambulatory Visit (INDEPENDENT_AMBULATORY_CARE_PROVIDER_SITE_OTHER): Admitting: Licensed Clinical Social Worker

## 2017-11-02 DIAGNOSIS — F4310 Post-traumatic stress disorder, unspecified: Secondary | ICD-10-CM | POA: Diagnosis not present

## 2017-12-06 ENCOUNTER — Ambulatory Visit (HOSPITAL_COMMUNITY): Payer: Self-pay | Admitting: Licensed Clinical Social Worker

## 2017-12-06 ENCOUNTER — Encounter

## 2018-01-07 ENCOUNTER — Encounter (HOSPITAL_COMMUNITY): Payer: Self-pay | Admitting: Licensed Clinical Social Worker

## 2018-01-07 NOTE — Progress Notes (Signed)
Rodney Beard is a 52 y.o. male patient who presents for help w/ his PTSD and anxiety at workplace. He is active, engaged, polite insession and makes strong eye contact. 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 Counselor spends time inquiring about pt's family hx and goals for treatment. Pt reports he does not want to discuss his trauma from combat but that this play a significant part in his daily worry. Pt was advised to make a f/u appointment w/ another clinician in this office since this writer cannot bill [employer].         Margo Common, LCAS-A

## 2018-10-30 ENCOUNTER — Emergency Department (HOSPITAL_COMMUNITY)
Admission: EM | Admit: 2018-10-30 | Discharge: 2018-10-30 | Disposition: A | Discharge status: Home / Self Care | Attending: Emergency Medicine | Admitting: Emergency Medicine

## 2018-10-30 ENCOUNTER — Emergency Department (HOSPITAL_COMMUNITY)

## 2018-10-30 ENCOUNTER — Encounter (HOSPITAL_COMMUNITY): Payer: Self-pay | Admitting: Emergency Medicine

## 2018-10-30 ENCOUNTER — Other Ambulatory Visit: Payer: Self-pay

## 2018-10-30 DIAGNOSIS — Z79899 Other long term (current) drug therapy: Secondary | ICD-10-CM | POA: Insufficient documentation

## 2018-10-30 DIAGNOSIS — E119 Type 2 diabetes mellitus without complications: Secondary | ICD-10-CM | POA: Insufficient documentation

## 2018-10-30 DIAGNOSIS — R079 Chest pain, unspecified: Secondary | ICD-10-CM

## 2018-10-30 DIAGNOSIS — R232 Flushing: Secondary | ICD-10-CM | POA: Insufficient documentation

## 2018-10-30 DIAGNOSIS — I509 Heart failure, unspecified: Secondary | ICD-10-CM | POA: Diagnosis not present

## 2018-10-30 DIAGNOSIS — Z95810 Presence of automatic (implantable) cardiac defibrillator: Secondary | ICD-10-CM | POA: Diagnosis not present

## 2018-10-30 DIAGNOSIS — R0789 Other chest pain: Secondary | ICD-10-CM | POA: Diagnosis not present

## 2018-10-30 DIAGNOSIS — R0602 Shortness of breath: Secondary | ICD-10-CM | POA: Insufficient documentation

## 2018-10-30 DIAGNOSIS — Z7901 Long term (current) use of anticoagulants: Secondary | ICD-10-CM | POA: Diagnosis not present

## 2018-10-30 DIAGNOSIS — R61 Generalized hyperhidrosis: Secondary | ICD-10-CM | POA: Insufficient documentation

## 2018-10-30 DIAGNOSIS — I110 Hypertensive heart disease with heart failure: Secondary | ICD-10-CM | POA: Diagnosis not present

## 2018-10-30 LAB — CBC
HCT: 40.8 % (ref 39.0–52.0)
Hemoglobin: 13 g/dL (ref 13.0–17.0)
MCH: 31.6 pg (ref 26.0–34.0)
MCHC: 31.9 g/dL (ref 30.0–36.0)
MCV: 99.3 fL (ref 80.0–100.0)
Platelets: 227 10*3/uL (ref 150–400)
RBC: 4.11 MIL/uL — ABNORMAL LOW (ref 4.22–5.81)
RDW: 11.7 % (ref 11.5–15.5)
WBC: 9.1 10*3/uL (ref 4.0–10.5)
nRBC: 0 % (ref 0.0–0.2)

## 2018-10-30 LAB — BASIC METABOLIC PANEL
Anion gap: 9 (ref 5–15)
BUN: 16 mg/dL (ref 6–20)
CO2: 24 mmol/L (ref 22–32)
Calcium: 8.4 mg/dL — ABNORMAL LOW (ref 8.9–10.3)
Chloride: 105 mmol/L (ref 98–111)
Creatinine, Ser: 1.16 mg/dL (ref 0.61–1.24)
GFR calc Af Amer: >60 mL/min (ref >60)
GFR calc non Af Amer: >60 mL/min (ref >60)
Glucose, Bld: 184 mg/dL — ABNORMAL HIGH (ref 70–99)
Potassium: 3.7 mmol/L (ref 3.5–5.1)
Sodium: 138 mmol/L (ref 135–145)

## 2018-10-30 LAB — TROPONIN I (HIGH SENSITIVITY)
Troponin I (High Sensitivity): 10 ng/L (ref <18)
Troponin I (High Sensitivity): 9 ng/L (ref <18)

## 2018-10-30 MED ORDER — SODIUM CHLORIDE 0.9% FLUSH: 3.0000 mL | Freq: Once | INTRAVENOUS | Status: DC

## 2018-10-30 NOTE — ED Notes (Signed)
Pt pacemaker interrogated. Spoke with representative at [employer] and they will call back with results.

## 2018-10-30 NOTE — ED Provider Notes (Signed)
Patient seen/examined in the Emergency Department in conjunction with Advanced Practice Provider [place] Patient reports chest pain that started this morning.  Pt has extensive history including nonischemic cardiomyopathy and VT Exam : awake/alert, no distress, appropriate Plan: labs pending at this time Interrogation of pacemaker/ICD performed      Ripley Fraise, MD 10/30/18 [phone]

## 2018-10-30 NOTE — Discharge Instructions (Signed)
Please follow up with your cardiologist. Return here for any new/acute changes.

## 2018-10-30 NOTE — ED Provider Notes (Signed)
[facility]-EMERGENCY DEPT Provider Note   CSN: [id number] Arrival date & time: 10/30/18  0503     History   Chief Complaint Chief Complaint  Patient presents with  . Chest Pain    HPI Rodney Beard is a 53 y.o. male.     The history is provided by the patient and medical records.  Chest Pain   53 year old male with history of A. fib, ventricular tachycardia with pacemaker and defibrillator in place, diabetes, hypertension, chronic back pain, presenting to the ED with chest pain.  States this woke him from sleep around 4 AM.  States he felt very hot and flushed so decided to go to the bathroom.  States he started getting a little sweaty and clammy and noticed some left-sided chest pressure.  He did have some brief shortness of breath.  No radiation of pain into the shoulders or neck.  States pain is resolved at this time.  He has a [employer] defibrillator and pacemaker in place, states he was notified a few weeks ago that he had gone back into A. fib and plan was for electrocardioversion if he did not convert spontaneously.  States he is not entirely sure what rhythm he is in at this time.  He does not think his defibrillator fired.  He is anticoagulated with Eliquis.  He denies any cough, fever, recent URI type symptoms.  States he is otherwise been well.  He did check his blood pressure at home, 140s over 90s before he left the house.  He is [facility].  Past Medical History:  Diagnosis Date  . Atrial fibrillation (HCC) 2013  . Chronic back pain   . Diabetes (HCC)    diet controlled  . Hypertension   . Ventricular tachycardia (HCC) 2017    Patient Active Problem List   Diagnosis Date Noted  . Cardiomyopathy, idiopathic (HCC) 01/21/2016  . SVT (supraventricular tachycardia) (HCC) 01/20/2016  . Wide-complex tachycardia (HCC) 01/19/2016  . Atrial fibrillation with RVR (HCC) 01/15/2016  . CHF (congestive heart failure) (HCC)  01/15/2016  . Diabetes (HCC) 01/15/2016  . HTN (hypertension) 01/15/2016    Past Surgical History:  Procedure Laterality Date  . ELECTROPHYSIOLOGY STUDY     per the patient, Sept 2017, "unable to fix his arrhythmia"  . IMPLANTABLE CARDIOVERTER DEFIBRILLATOR IMPLANT  01/23/2016  . KNEE ARTHROSCOPY W/ MENISCAL REPAIR Left   . left heart cath     per the patient , negetive for CAD, Sept 2017        Home Medications    Prior to Admission medications   Medication Sig Start Date End Date Taking? Authorizing Provider  apixaban (ELIQUIS) 5 MG TABS tablet Take 5 mg by mouth 2 (two) times daily.    [provider]  furosemide (LASIX) 40 MG tablet Take 40 mg by mouth daily.     [provider]  gabapentin (NEURONTIN) 300 MG capsule Take 300 mg by mouth at bedtime.    [provider]  losartan (COZAAR) 25 MG tablet Take 25 mg by mouth daily.    [provider]  PARoxetine (PAXIL) 10 MG tablet Take one tab daily for one week and than 2 tab daily 09/30/17   Arfeen, Phillips GroutSyed T, MD  sotalol (BETAPACE) 120 MG tablet Take 120 mg by mouth 2 (two) times daily.    [provider]    Family History Family History  Problem Relation Age of Onset  . Heart disease Mother   .  Stroke Father   . Hypertension Father   . Heart attack Brother     Social History Social History   Tobacco Use  . Smoking status: Never Smoker  . Smokeless tobacco: Never Used  Substance Use Topics  . Alcohol use: No  . Drug use: No     Allergies   Iodine and Shellfish allergy   Review of Systems Review of Systems  Cardiovascular: Positive for chest pain.  All other systems reviewed and are negative.    Physical Exam Updated Vital Signs BP (!) 158/114 (BP Location: Left Arm)   Pulse 81   Temp 98.7 F (37.1 C) (Oral)   Resp 18   Ht 6' (1.829 m)   Wt 131.5 kg   SpO2 100%   BMI 39.33 kg/m   Physical Exam Vitals signs and nursing note reviewed.   Constitutional:      Appearance: He is well-developed.  HENT:     Head: Normocephalic and atraumatic.  Eyes:     Conjunctiva/sclera: Conjunctivae normal.     Pupils: Pupils are equal, round, and reactive to light.  Neck:     Musculoskeletal: Normal range of motion.  Cardiovascular:     Rate and Rhythm: Normal rate and regular rhythm.     Heart sounds: Normal heart sounds.  Pulmonary:     Effort: Pulmonary effort is normal.     Breath sounds: Normal breath sounds. No decreased breath sounds, wheezing or rhonchi.  Abdominal:     General: Bowel sounds are normal.     Palpations: Abdomen is soft.  Musculoskeletal: Normal range of motion.  Skin:    General: Skin is warm and dry.  Neurological:     Mental Status: He is alert and oriented to person, place, and time.      ED Treatments / Results  Labs (all labs ordered are listed, but only abnormal results are displayed) Labs Reviewed  BASIC METABOLIC PANEL - Abnormal; Notable for the following components:      Result Value   Glucose, Bld 184 (*)    Calcium 8.4 (*)    All other components within normal limits  CBC - Abnormal; Notable for the following components:   RBC 4.11 (*)    All other components within normal limits  TROPONIN I (HIGH SENSITIVITY)  TROPONIN I (HIGH SENSITIVITY)    EKG EKG Interpretation  Date/Time:  Sunday October 30 2018 05:15:07 EDT Ventricular Rate:  80 PR Interval:    QRS Duration: 89 QT Interval:  410 QTC Calculation: 467 R Axis:   68 Text Interpretation:  Atrial-paced complexes Ventricular premature complex Borderline prolonged PR interval Abnormal T, consider ischemia, lateral leads Confirmed by Ripley Fraise [phone]) on 10/30/2018 5:20:16 AM   Radiology Dg Chest 2 View  Result Date: 10/30/2018 CLINICAL DATA:  LEFT chest pain starting this morning. EXAM: CHEST - 2 VIEW COMPARISON:  None. FINDINGS: LEFT chest wall pacemaker/ICD apparatus in place. Borderline cardiomegaly. Streaky opacities  within the retrocardiac space, seen on the lateral view only, suspicious for early developing pneumonia. No pleural effusion or pneumothorax seen. Osseous structures about the chest are unremarkable. IMPRESSION: 1. Streaky opacities within the retrocardiac space, seen on the lateral view only, suspicious for early developing pneumonia. 2. Borderline cardiomegaly. Electronically Signed   By: Franki Cabot M.D.   On: 10/30/2018 05:39    Procedures Procedures (including critical care time)  Medications Ordered in ED Medications  sodium chloride flush (NS) 0.9 % injection 3 mL (has no administration in  time range)     Initial Impression / Assessment and Plan / ED Course  I have reviewed the triage vital signs and the nursing notes.  Pertinent labs & imaging results that were available during my care of the patient were reviewed by me and considered in my medical decision making (see chart for details).  53 year old male here with episode of chest pain.  States he woke up around 4 AM and felt hot.  He went to the bathroom and had episode of left-sided chest pressure without radiation to neck or shoulder.  Brief shortness of breath and feeling clammy.  By time of arrival here patient is asymptomatic.  He is afebrile and nontoxic in appearance.  His lungs are clear without wheezes or rhonchi.  His vitals are stable, initial BP readings were high however his blood pressure cuff was on upside down.  Once adjusted his blood pressure is 142/97 on my recheck.  EKG with some lateral T wave inversions, however this is unchanged from prior.  Chest x-ray was performed revealing streaky opacity in the retrocardiac space, possible developing pneumonia.  Patient has not had any cough, fever, or other URI type symptoms so feel this is less likely.  Screening labs are pending.  Will interrogate pacemaker as well.  Pacemaker was interrogated, rep reports there was an episode of A. fib from 6/22- 6/25 and a short  recurrent episode on 6/26 but no other abnormalities noted.  Labs are reassuring, initial troponin is negative.  He remains without any chest pain here.  We have discussed his results.  We will plan for delta troponin and if negative I feel he is stable for discharge home to follow-up closely with his cardiologist at the [facility].  Care will be signed out to [facility] to follow-up on delta troponin, reassess, disposition appropriately.  Final Clinical Impressions(s) / ED Diagnoses   Final diagnoses:  Chest pain in adult    ED Discharge Orders    None       Garlon Hatchet, PA-C 10/30/18 [id number]    Zadie Rhine, MD 10/30/18 [phone]

## 2018-10-30 NOTE — ED Notes (Signed)
Bed: WA12 Expected date:  Expected time:  Means of arrival:  Comments: 

## 2018-10-30 NOTE — ED Triage Notes (Signed)
Patient complaining of left chest pain. Patient states it started around 4 am. Patient states that he is also feeling nauseas.

## 2018-12-06 ENCOUNTER — Encounter (HOSPITAL_COMMUNITY): Payer: Self-pay

## 2018-12-06 ENCOUNTER — Other Ambulatory Visit: Payer: Self-pay

## 2018-12-06 ENCOUNTER — Emergency Department (HOSPITAL_COMMUNITY)
Admission: EM | Admit: 2018-12-06 | Discharge: 2018-12-07 | Disposition: A | Discharge status: Home / Self Care | Attending: Emergency Medicine | Admitting: Emergency Medicine

## 2018-12-06 ENCOUNTER — Emergency Department (HOSPITAL_COMMUNITY)

## 2018-12-06 DIAGNOSIS — Y712 Prosthetic and other implants, materials and accessory cardiovascular devices associated with adverse incidents: Secondary | ICD-10-CM | POA: Diagnosis not present

## 2018-12-06 DIAGNOSIS — Z79899 Other long term (current) drug therapy: Secondary | ICD-10-CM | POA: Diagnosis not present

## 2018-12-06 DIAGNOSIS — Z7901 Long term (current) use of anticoagulants: Secondary | ICD-10-CM | POA: Insufficient documentation

## 2018-12-06 DIAGNOSIS — R2243 Localized swelling, mass and lump, lower limb, bilateral: Secondary | ICD-10-CM | POA: Diagnosis not present

## 2018-12-06 DIAGNOSIS — I509 Heart failure, unspecified: Secondary | ICD-10-CM | POA: Insufficient documentation

## 2018-12-06 DIAGNOSIS — R55 Syncope and collapse: Secondary | ICD-10-CM | POA: Diagnosis not present

## 2018-12-06 DIAGNOSIS — T82897A Other specified complication of cardiac prosthetic devices, implants and grafts, initial encounter: Secondary | ICD-10-CM | POA: Diagnosis not present

## 2018-12-06 DIAGNOSIS — E119 Type 2 diabetes mellitus without complications: Secondary | ICD-10-CM | POA: Diagnosis not present

## 2018-12-06 DIAGNOSIS — R42 Dizziness and giddiness: Secondary | ICD-10-CM | POA: Insufficient documentation

## 2018-12-06 DIAGNOSIS — Z4502 Encounter for adjustment and management of automatic implantable cardiac defibrillator: Secondary | ICD-10-CM

## 2018-12-06 DIAGNOSIS — I110 Hypertensive heart disease with heart failure: Secondary | ICD-10-CM | POA: Insufficient documentation

## 2018-12-06 DIAGNOSIS — Z95810 Presence of automatic (implantable) cardiac defibrillator: Secondary | ICD-10-CM | POA: Insufficient documentation

## 2018-12-06 DIAGNOSIS — I4901 Ventricular fibrillation: Secondary | ICD-10-CM | POA: Insufficient documentation

## 2018-12-06 DIAGNOSIS — Z7984 Long term (current) use of oral hypoglycemic drugs: Secondary | ICD-10-CM | POA: Diagnosis not present

## 2018-12-06 LAB — CBC
HCT: 40.2 % (ref 39.0–52.0)
Hemoglobin: 13.4 g/dL (ref 13.0–17.0)
MCH: 32.7 pg (ref 26.0–34.0)
MCHC: 33.3 g/dL (ref 30.0–36.0)
MCV: 98 fL (ref 80.0–100.0)
Platelets: 239 10*3/uL (ref 150–400)
RBC: 4.1 MIL/uL — ABNORMAL LOW (ref 4.22–5.81)
RDW: 11.9 % (ref 11.5–15.5)
WBC: 10.1 10*3/uL (ref 4.0–10.5)
nRBC: 0 % (ref 0.0–0.2)

## 2018-12-06 LAB — BASIC METABOLIC PANEL
Anion gap: 9 (ref 5–15)
BUN: 15 mg/dL (ref 6–20)
CO2: 23 mmol/L (ref 22–32)
Calcium: 9.2 mg/dL (ref 8.9–10.3)
Chloride: 107 mmol/L (ref 98–111)
Creatinine, Ser: 1.12 mg/dL (ref 0.61–1.24)
GFR calc Af Amer: >60 mL/min (ref >60)
GFR calc non Af Amer: >60 mL/min (ref >60)
Glucose, Bld: 221 mg/dL — ABNORMAL HIGH (ref 70–99)
Potassium: 4.2 mmol/L (ref 3.5–5.1)
Sodium: 139 mmol/L (ref 135–145)

## 2018-12-06 MED ORDER — SODIUM CHLORIDE 0.9% FLUSH: 3.0000 mL | Freq: Once | INTRAVENOUS | Status: DC

## 2018-12-06 NOTE — ED Notes (Signed)
Traveled to [facility]

## 2018-12-06 NOTE — ED Provider Notes (Signed)
[facility] EMERGENCY DEPARTMENT Provider Note   CSN: [id number] Arrival date & time: 12/06/18  2244    History   Chief Complaint Chief Complaint  Patient presents with  . Dizziness    HPI Rodney Beard is a 53 y.o. male.     HPI  This is a 53 year old male with a history of atrial fibrillation, diabetes, hypertension, nonischemic cardiomyopathy, ICD secondary to ventricular tachycardia who presents with dizziness and ICD firing.  Patient reports that he was putting on close when he began to felt very dizzy.  He sat down.  Next thing he knew he felt a shock from his defibrillator.  He thinks he may have passed out prior to the shock.  He currently reports that he is at his baseline.  However, he states that his pulse has been "erratic" since that time.  Patient with a history of atrial fibrillation.  Denies any recent changes in medications.  Saw his cardiologist earlier today.  Denies any recent illnesses.  Denies fever, cough, chest pain, shortness of breath, leg swelling.  Past Medical History:  Diagnosis Date  . Atrial fibrillation (HCC) 2013  . Chronic back pain   . Diabetes (HCC)    diet controlled  . Hypertension   . Ventricular tachycardia (HCC) 2017    Patient Active Problem List   Diagnosis Date Noted  . Cardiomyopathy, idiopathic (HCC) 01/21/2016  . SVT (supraventricular tachycardia) (HCC) 01/20/2016  . Wide-complex tachycardia (HCC) 01/19/2016  . Atrial fibrillation with RVR (HCC) 01/15/2016  . CHF (congestive heart failure) (HCC) 01/15/2016  . Diabetes (HCC) 01/15/2016  . HTN (hypertension) 01/15/2016    Past Surgical History:  Procedure Laterality Date  . ELECTROPHYSIOLOGY STUDY     per the patient, Sept 2017, "unable to fix his arrhythmia"  . IMPLANTABLE CARDIOVERTER DEFIBRILLATOR IMPLANT  01/23/2016  . KNEE ARTHROSCOPY W/ MENISCAL REPAIR Left   . left heart cath     per the patient , negetive for CAD, Sept 2017        Home  Medications    Prior to Admission medications   Medication Sig Start Date End Date Taking? Authorizing Provider  apixaban (ELIQUIS) 5 MG TABS tablet Take 5 mg by mouth 2 (two) times daily.    [provider]  furosemide (LASIX) 40 MG tablet Take 40 mg by mouth daily.     [provider]  gabapentin (NEURONTIN) 300 MG capsule Take 300 mg by mouth at bedtime as needed (pain).     [provider]  losartan (COZAAR) 25 MG tablet Take 25 mg by mouth 2 (two) times a day.     [provider]  metFORMIN (GLUCOPHAGE) 500 MG tablet Take 500 mg by mouth 2 (two) times daily with a meal.    [provider]  PARoxetine (PAXIL) 10 MG tablet Take one tab daily for one week and than 2 tab daily Patient taking differently: Take 20 mg by mouth daily.  09/30/17   Arfeen, Phillips Grout, MD  sotalol (BETAPACE) 120 MG tablet Take 120 mg by mouth 2 (two) times daily.    [provider]    Family History Family History  Problem Relation Age of Onset  . Heart disease Mother   . Stroke Father   . Hypertension Father   . Heart attack Brother     Social History Social History   Tobacco Use  . Smoking status: Never Smoker  . Smokeless tobacco: Never Used  Substance Use Topics  .  Alcohol use: No  . Drug use: No     Allergies   Iodine and Shellfish allergy   Review of Systems Review of Systems  Constitutional: Negative for fever.  Respiratory: Negative for cough and shortness of breath.   Cardiovascular: Negative for chest pain.  Gastrointestinal: Negative for abdominal pain, nausea and vomiting.  Genitourinary: Negative for dysuria.  Neurological: Positive for dizziness and syncope.  All other systems reviewed and are negative.    Physical Exam Updated Vital Signs BP (!) 132/91   Pulse 79   Temp (!) 97.3 F (36.3 C) (Oral)   Resp (!) 22   SpO2 99%   Physical Exam Vitals signs and nursing note reviewed.  Constitutional:      Appearance: He  is well-developed. He is obese. He is not ill-appearing.  HENT:     Head: Normocephalic and atraumatic.     Mouth/Throat:     Mouth: Mucous membranes are moist.  Eyes:     Pupils: Pupils are equal, round, and reactive to light.  Neck:     Musculoskeletal: Neck supple.  Cardiovascular:     Rate and Rhythm: Normal rate. Rhythm irregular.     Heart sounds: Normal heart sounds. No murmur.  Pulmonary:     Effort: Pulmonary effort is normal. No respiratory distress.     Breath sounds: Normal breath sounds. No wheezing.     Comments: Defibrillator noted right upper chest Abdominal:     General: Bowel sounds are normal.     Palpations: Abdomen is soft.     Tenderness: There is no abdominal tenderness. There is no rebound.  Musculoskeletal:     Comments: Trace lower extremity edema  Lymphadenopathy:     Cervical: No cervical adenopathy.  Skin:    General: Skin is warm and dry.  Neurological:     Mental Status: He is alert and oriented to person, place, and time.  Psychiatric:        Mood and Affect: Mood normal.      ED Treatments / Results  Labs (all labs ordered are listed, but only abnormal results are displayed) Labs Reviewed  BASIC METABOLIC PANEL - Abnormal; Notable for the following components:      Result Value   Glucose, Bld 221 (*)    All other components within normal limits  CBC - Abnormal; Notable for the following components:   RBC 4.10 (*)    All other components within normal limits  MAGNESIUM  TROPONIN I (HIGH SENSITIVITY)  TROPONIN I (HIGH SENSITIVITY)    EKG EKG Interpretation  Date/Time:  Tuesday December 06 2018 23:22:02 EDT Ventricular Rate:  100 PR Interval:    QRS Duration: 110 QT Interval:  354 QTC Calculation: 406 R Axis:   37 Text Interpretation:  A-V dual-paced complexes w/ some inhibition No further analysis attempted due to paced rhythm Frequent PVCs Confirmed by Thayer Jew [phone]) on 12/06/2018 11:31:28 PM   Radiology Dg Chest  Portable 1 View  Result Date: 12/06/2018 CLINICAL DATA:  Recent defibrillator firing EXAM: PORTABLE CHEST 1 VIEW COMPARISON:  10/30/2018 FINDINGS: Cardiac shadow is enlarged but stable. Defibrillator is again noted. Lungs are well aerated without focal infiltrate or sizable effusion. No bony abnormality is noted. IMPRESSION: No acute abnormality seen. Electronically Signed   By: Inez Catalina M.D.   On: 12/06/2018 23:34    Procedures Procedures (including critical care time)  Medications Ordered in ED Medications  sodium chloride flush (NS) 0.9 % injection 3 mL (has no  administration in time range)     Initial Impression / Assessment and Plan / ED Course  I have reviewed the triage vital signs and the nursing notes.  Pertinent labs & imaging results that were available during my care of the patient were reviewed by me and considered in my medical decision making (see chart for details).        Patient presents with dizziness and near syncopal episode a followed bad defibrillator discharge.  He is currently asymptomatic.  Overall nontoxic-appearing and vital signs are largely reassuring.  He appears to be in a paced rhythm with frequent PVCs on EKG.  Lab work obtained including metabolites and magnesium.  Denies any recent medication changes.  Lab work-up is largely reassuring.  Including troponin and magnesium.  [employer] defibrillator was interrogated.  He had an event at a ventricular fibrillation event at approximately prior to arrival that was aborted by the defibrillator.  He has not had any subsequent events.  I did discuss the patient with cardiology fellow, Rose.  Given that he has not had any subsequent events and all of his lab work-up looks reassuring, he recommends discharge with close follow-up.  No medication adjustment at this time.  I discussed this with the patient and he is agreeable to plan.  He knows that if he has recurrence of symptoms or his defibrillator fires  again, he needs to be reevaluated immediately.  After history, exam, and medical workup I feel the patient has been appropriately medically screened and is safe for discharge home. Pertinent diagnoses were discussed with the patient. Patient was given return precautions.   Final Clinical Impressions(s) / ED Diagnoses   Final diagnoses:  Defibrillator discharge  Dizziness  Ventricular fibrillation [facility](HCC)    ED Discharge Orders    None       Wilkie AyeHorton, Mayer Maskerourtney F, MD 12/07/18 [phone]

## 2018-12-06 NOTE — ED Notes (Signed)
Pt pacemaker defibrillated

## 2018-12-06 NOTE — ED Triage Notes (Signed)
Pt states he has a defibrillator. Hx of afib. States he believes he had a syncopal episode and his defibrillator shocked him. He states dizziness is better at this time but pulse is erratic.

## 2018-12-07 LAB — TROPONIN I (HIGH SENSITIVITY)
Troponin I (High Sensitivity): 9 ng/L (ref <18)
Troponin I (High Sensitivity): 16 ng/L (ref <18)

## 2018-12-07 LAB — MAGNESIUM: Magnesium: 2.1 mg/dL (ref 1.7–2.4)

## 2018-12-07 NOTE — ED Notes (Signed)
Spoke with representative from [employer]. 2 episodes, one event of nonsustained @ 1230, another episode at 2230 where pt was in VF and converted

## 2018-12-07 NOTE — Discharge Instructions (Addendum)
You were seen today because your defibrillator fired.  This is likely related to an episode of ventricular fibrillation which was recorded by your defibrillator.  All your lab work-up is largely reassuring and you have not had any subsequent events.  Follow-up very closely with your cardiologist.  If you have recurrence of symptoms or your defibrillator fires again, you need to be reevaluated immediately.

## 2020-01-30 IMAGING — DX PORTABLE CHEST - 1 VIEW
1 series · 1 of 1 positions shown · non-contrast
Comparison: 10/30/2018

CLINICAL DATA: Recent defibrillator firing

EXAM:
PORTABLE CHEST 1 VIEW

[chest]
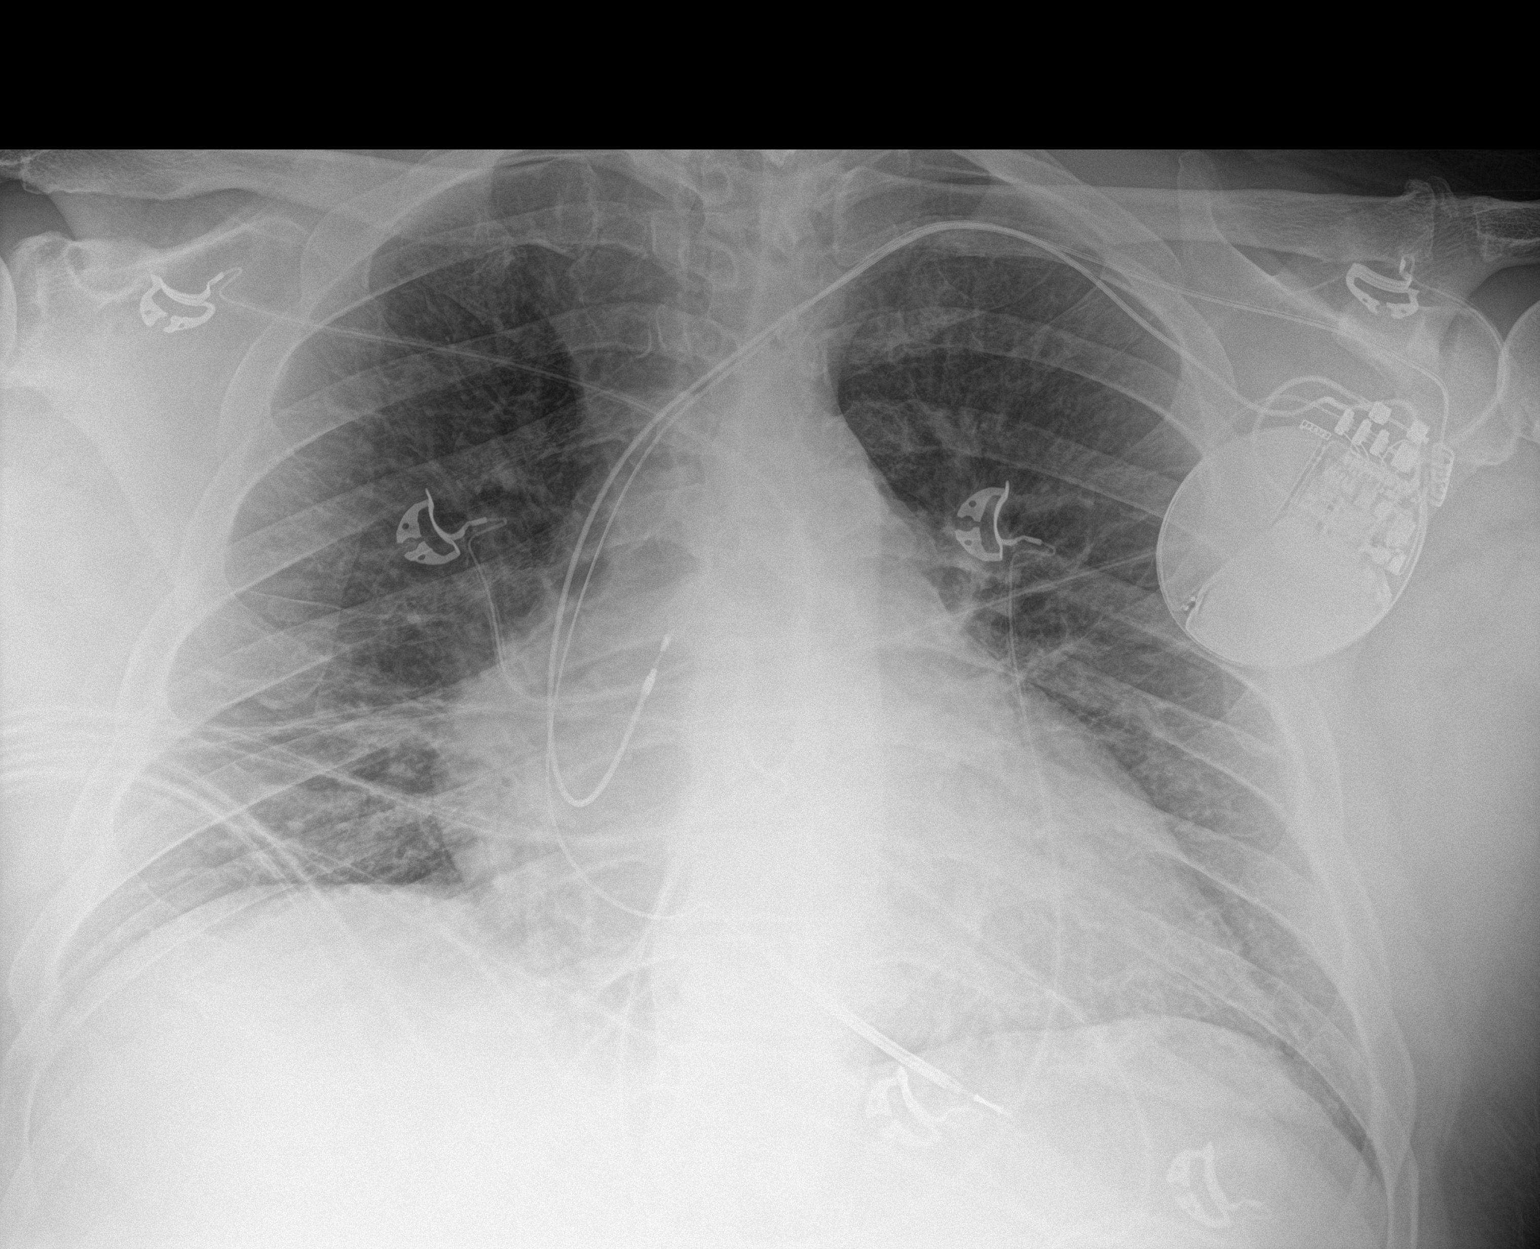

[1 of 1 positions shown; findings below may reference images not displayed]

FINDINGS: Cardiac shadow is enlarged but stable. Defibrillator is again noted.
Lungs are well aerated without focal infiltrate or sizable effusion.
No bony abnormality is noted.
IMPRESSION: No acute abnormality seen.
